# Patient Record
Sex: Male | Born: 2000 | Race: White | Hispanic: No | Marital: Single | Smoking: Never smoker
Health system: Southern US, Community
[De-identification: ages and names within clinical notes are randomized; demographics above are authoritative.]

## PROBLEM LIST (undated history)

## (undated) DIAGNOSIS — K311 Adult hypertrophic pyloric stenosis: Secondary | ICD-10-CM

## (undated) HISTORY — PX: ABDOMINAL SURGERY: SHX537

---
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
## 2018-02-08 ENCOUNTER — Other Ambulatory Visit: Payer: Self-pay

## 2018-02-08 ENCOUNTER — Emergency Department
Admission: EM | Admit: 2018-02-08 | Discharge: 2018-02-08 | Disposition: A | Discharge status: Home / Self Care | Payer: BC Managed Care – PPO | Attending: Emergency Medicine | Admitting: Emergency Medicine

## 2018-02-08 DIAGNOSIS — W450XXA Nail entering through skin, initial encounter: Secondary | ICD-10-CM | POA: Insufficient documentation

## 2018-02-08 DIAGNOSIS — Z23 Encounter for immunization: Secondary | ICD-10-CM | POA: Diagnosis not present

## 2018-02-08 DIAGNOSIS — Y999 Unspecified external cause status: Secondary | ICD-10-CM | POA: Diagnosis not present

## 2018-02-08 DIAGNOSIS — Y929 Unspecified place or not applicable: Secondary | ICD-10-CM | POA: Diagnosis not present

## 2018-02-08 DIAGNOSIS — S91311A Laceration without foreign body, right foot, initial encounter: Secondary | ICD-10-CM | POA: Diagnosis not present

## 2018-02-08 DIAGNOSIS — Y9301 Activity, walking, marching and hiking: Secondary | ICD-10-CM | POA: Diagnosis not present

## 2018-02-08 DIAGNOSIS — S99921A Unspecified injury of right foot, initial encounter: Secondary | ICD-10-CM | POA: Diagnosis present

## 2018-02-08 MED ORDER — TETANUS-DIPHTH-ACELL PERTUSSIS 5-2.5-18.5 LF-MCG/0.5 IM SUSP
0.5000 mL | Freq: Once | INTRAMUSCULAR | Status: AC
  Administered 2018-02-08: 0.5 mL via INTRAMUSCULAR
  Filled 2018-02-08: qty 0.5

## 2018-02-08 NOTE — ED Triage Notes (Signed)
Pt arrives to ED via POV from home with c/o abrasion to the lateral aspect of the pinkie toe on the RIGHT foot that happened 1hr PTA. No bleeding at this time; pt states he's unsure of last Tetanus booster and requesting an update "just to be safe". Pt is A&O, in NAD; RR even, regular, and unlabored.

## 2018-02-08 NOTE — ED Provider Notes (Signed)
[facility] Emergency Department Provider Note ____________________________________________  Time seen: 2040  I have reviewed the triage vital signs and the nursing notes.  HISTORY  Chief Complaint  Laceration  HPI Levi Carter is a 17 y.o. male presents to the ED accompanied by his family member for evaluation of a superficial laceration to the lateral right foot.  Patient describes about an hour prior to arrival, he accidentally cut his foot on exposed nail while wearing a sock.  He denies any other injury or puncture wound.  He presents now with a superficial lateral skin flap to the lateral aspect of the right foot at the pinky toe.  No other injuries reported. Patient is also also requesting a tetanus booster.  He is currently homeschooled and denies any recent vaccines.  History reviewed. No pertinent past medical history.  There are no active problems to display for this patient.  History reviewed. No pertinent surgical history.  Prior to Admission medications   Medication Sig Start Date End Date Taking? Authorizing Provider  cyclobenzaprine (FLEXERIL) 5 MG tablet Take 1 tablet (5 mg total) by mouth 3 (three) times daily as needed for muscle spasms. 10/03/16   Evon SlackGaines, Thomas C, PA-C  ibuprofen (ADVIL,MOTRIN) 600 MG tablet Take 1 tablet (600 mg total) by mouth every 6 (six) hours as needed for moderate pain. 10/03/16   Evon SlackGaines, Thomas C, PA-C    Allergies Patient has no known allergies.  No family history on file.  Social History Social History   Tobacco Use  . Smoking status: Never Smoker  . Smokeless tobacco: Never Used  Substance Use Topics  . Alcohol use: No  . Drug use: Not on file    Review of Systems  Constitutional: Negative for fever. Cardiovascular: Negative for chest pain. Respiratory: Negative for shortness of breath. Musculoskeletal: Negative for back pain. Skin: Negative for rash.  Right foot/toe laceration as  above. Neurological: Negative for headaches, focal weakness or numbness. ____________________________________________  PHYSICAL EXAM:  VITAL SIGNS: ED Triage Vitals [02/08/18 2033]  Enc Vitals Group     BP (!) 145/75     Pulse Rate (!) 109     Resp 18     Temp 97.9 F (36.6 C)     Temp Source Oral     SpO2 100 %     Weight 145 lb (65.8 kg)     Height 6\' 2"  (1.88 m)     Head Circumference      Peak Flow      Pain Score 0     Pain Loc      Pain Edu?      Excl. in GC?     Constitutional: Alert and oriented. Well appearing and in no distress. Head: Normocephalic and atraumatic. Cardiovascular: Normal rate, regular rhythm. Normal distal pulses. Normal cap refill.  Respiratory: Normal respiratory effort. No wheezes/rales/rhonchi. Gastrointestinal: Soft and nontender. No distention. Musculoskeletal: Nontender with normal range of motion in all extremities.  Neurologic:  Normal gait without ataxia. Normal speech and language. No gross focal neurologic deficits are appreciated. Skin:  Skin is warm, dry and intact. No rash noted.  Patient is noted to have a superficial lap laceration to the epidermal scan of the lateral right foot at the base of the pinky toe.  No active bleeding is appreciated.  Signs of infection are noted.  The wound is cleansed with soap and water and a single disposable bandages placed. ____________________________________________  PROCEDURES  Procedures Tdap 0.5 ml IM  ____________________________________________  INITIAL IMPRESSION / ASSESSMENT AND PLAN / ED COURSE  Patient with ED evaluation of a superficial laceration to the right foot and pinky.  Patient's wound is superficial and does not require any closure with either wound adhesive for sutures.  Wound is covered after local cleansing.  Patient is given wound care instructions.  He has a request for a Tdap booster as he is home schooled and has not had his most recent round of vaccines.  Patient will  follow up with his newly established PCP as scheduled. ____________________________________________  FINAL CLINICAL IMPRESSION(S) / ED DIAGNOSES  Final diagnoses:  Laceration of right foot, initial encounter      Lissa Hoard, PA-C 02/08/18 2104    Phineas Semen, MD 02/08/18 [phone]

## 2018-02-08 NOTE — Discharge Instructions (Signed)
Keep the wound clean, dry and covered. Wash only with soap and water. You have received a tetanus booster today. See your primary provider as scheduled.

## 2018-07-25 ENCOUNTER — Emergency Department
Admission: EM | Admit: 2018-07-25 | Discharge: 2018-07-26 | Disposition: A | Discharge status: Home / Self Care | Payer: BC Managed Care – PPO | Attending: Emergency Medicine | Admitting: Emergency Medicine

## 2018-07-25 ENCOUNTER — Emergency Department: Payer: BC Managed Care – PPO

## 2018-07-25 ENCOUNTER — Encounter: Payer: Self-pay | Admitting: Emergency Medicine

## 2018-07-25 DIAGNOSIS — X58XXXA Exposure to other specified factors, initial encounter: Secondary | ICD-10-CM | POA: Diagnosis not present

## 2018-07-25 DIAGNOSIS — K2210 Ulcer of esophagus without bleeding: Secondary | ICD-10-CM | POA: Diagnosis not present

## 2018-07-25 DIAGNOSIS — T18128A Food in esophagus causing other injury, initial encounter: Secondary | ICD-10-CM | POA: Diagnosis not present

## 2018-07-25 DIAGNOSIS — Y929 Unspecified place or not applicable: Secondary | ICD-10-CM | POA: Diagnosis not present

## 2018-07-25 DIAGNOSIS — T18108A Unspecified foreign body in esophagus causing other injury, initial encounter: Secondary | ICD-10-CM

## 2018-07-25 DIAGNOSIS — Z8719 Personal history of other diseases of the digestive system: Secondary | ICD-10-CM | POA: Diagnosis not present

## 2018-07-25 DIAGNOSIS — K3189 Other diseases of stomach and duodenum: Secondary | ICD-10-CM | POA: Diagnosis not present

## 2018-07-25 HISTORY — DX: Adult hypertrophic pyloric stenosis: K31.1

## 2018-07-25 MED ORDER — DEXAMETHASONE SODIUM PHOSPHATE 10 MG/ML IJ SOLN
INTRAMUSCULAR | Status: AC
  Filled 2018-07-25: qty 1

## 2018-07-25 MED ORDER — PROPOFOL 10 MG/ML IV BOLUS
INTRAVENOUS | Status: AC
  Filled 2018-07-25: qty 20

## 2018-07-25 MED ORDER — MIDAZOLAM HCL 2 MG/2ML IJ SOLN
INTRAMUSCULAR | Status: AC
  Filled 2018-07-25: qty 2

## 2018-07-25 MED ORDER — SUCCINYLCHOLINE CHLORIDE 20 MG/ML IJ SOLN
INTRAMUSCULAR | Status: AC
  Filled 2018-07-25: qty 1

## 2018-07-25 MED ORDER — GLUCAGON HCL (RDNA) 1 MG IJ SOLR
1.0000 mg | Freq: Once | INTRAMUSCULAR | Status: AC
  Administered 2018-07-25: 1 mg via INTRAMUSCULAR
  Filled 2018-07-25 (×2): qty 1

## 2018-07-25 MED ORDER — ONDANSETRON HCL 4 MG/2ML IJ SOLN
INTRAMUSCULAR | Status: AC
  Filled 2018-07-25: qty 2

## 2018-07-25 MED ORDER — ROCURONIUM BROMIDE 50 MG/5ML IV SOLN
INTRAVENOUS | Status: AC
  Filled 2018-07-25: qty 1

## 2018-07-25 MED ORDER — FENTANYL CITRATE (PF) 100 MCG/2ML IJ SOLN
INTRAMUSCULAR | Status: AC
Start: 2018-07-25 — End: ?
  Filled 2018-07-25: qty 2

## 2018-07-25 NOTE — Anesthesia Preprocedure Evaluation (Addendum)
Anesthesia Evaluation  Patient identified by MRN, date of birth, ID band Patient awake    Reviewed: Allergy & Precautions, H&P , NPO status , Patient's Chart, lab work & pertinent test results  Airway Mallampati: I  TM Distance: >3 FB Neck ROM: full    Dental no notable dental hx. 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  Past Surgical History: No date: ABDOMINAL SURGERY  BMI    Body Mass Index:  17.97 kg/m      Reproductive/Obstetrics negative OB ROS                            Anesthesia Physical Anesthesia Plan  ASA: I and emergent  Anesthesia Plan: General ETT   Post-op Pain Management:    Induction:   PONV Risk Score and Plan: Propofol infusion and TIVA  Airway Management Planned:   Additional Equipment:   Intra-op Plan:   Post-operative Plan:   Informed Consent: I have reviewed the patients History and Physical, chart, labs and discussed the procedure including the risks, benefits and alternatives for the proposed anesthesia with the patient or authorized representative who has indicated his/her understanding and acceptance.   Dental Advisory Given  Plan Discussed with: Anesthesiologist, CRNA and Surgeon  Anesthesia Plan Comments:        Anesthesia Quick Evaluation

## 2018-07-25 NOTE — ED Notes (Signed)
Pt states no resolve thus far

## 2018-07-25 NOTE — ED Provider Notes (Signed)
[facility] Emergency Department Provider Note  ____________________________________________  Time seen: Approximately 11:37 PM  I have reviewed the triage vital signs and the nursing notes.   HISTORY  Chief Complaint Foreign Body    HPI Levi Carter is a 17 y.o. 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  He does not think he swallowed any bones.  Had a sudden onset of feeling like his esophagus was obstructed and that he could not swallow anything.  His father tried doing a Heimlich maneuver on him which did not resolve his symptoms.  He is tried swallowing cola throughout the day without relief.  Difficulty swallowing his secretions 2.  No vomiting.  No fevers chills chest pain or shortness of breath.  No cough.  Symptoms are constant without aggravating or alleviating factors at this point.      Past Medical History:  Diagnosis Date  . Pyloric stenosis      There are no active problems to display for this patient.    Past Surgical History:  Procedure Laterality Date  . ABDOMINAL SURGERY       Prior to Admission medications   Not on File     Allergies Patient has no known allergies.   No family history on file.  Social History Social History   Tobacco Use  . Smoking status: Never Smoker  . Smokeless tobacco: Never Used  Substance Use Topics  . Alcohol use: No  . Drug use: Never    Review of Systems  Constitutional:   No fever or chills.  ENT:   No sore throat. No rhinorrhea.  Inability to swallow as above Cardiovascular:   No chest pain or syncope. Respiratory:   No dyspnea or cough. Gastrointestinal:   Negative for abdominal pain, vomiting and diarrhea.  Musculoskeletal:   Negative for focal pain or swelling All other systems reviewed and are negative except as documented above in ROS and  HPI.  ____________________________________________   PHYSICAL EXAM:  VITAL SIGNS: ED Triage Vitals  Enc Vitals Group     BP 07/25/18 2038 134/84     Pulse Rate 07/25/18 2038 (!) 103     Resp 07/25/18 2038 18     Temp 07/25/18 2038 98.2 F (36.8 C)     Temp Source 07/25/18 2038 Oral     SpO2 07/25/18 2038 100 %     Weight 07/25/18 2039 140 lb (63.5 kg)     Height 07/25/18 2039 6\' 2"  (1.88 m)     Head Circumference --      Peak Flow --      Pain Score 07/25/18 2039 1     Pain Loc --      Pain Edu? --      Excl. in GC? --     Vital signs reviewed, nursing assessments reviewed.   Constitutional:   Alert and oriented. Non-toxic appearance. Eyes:   Conjunctivae are normal. EOMI. PERRL. ENT      Head:   Normocephalic and atraumatic.      Nose:   No congestion/rhinnorhea.       Mouth/Throat:   MMM, no pharyngeal erythema. No peritonsillar mass.       Neck:   No meningismus. Full ROM.  Nontender no crepitus Hematological/Lymphatic/Immunilogical:   No cervical lymphadenopathy. Cardiovascular:   RRR. Symmetric bilateral radial and DP pulses.  No murmurs.  Cap refill less than 2 seconds. Respiratory:   Normal respiratory effort without tachypnea/retractions. Breath sounds are clear and equal bilaterally. No wheezes/rales/rhonchi. Gastrointestinal:   Soft and nontender. Non distended. There is no CVA tenderness.  No rebound, rigidity, or guarding. Musculoskeletal:   Normal range of motion in all extremities. No joint effusions.  No lower extremity tenderness.  No edema. Neurologic:   Normal speech and language.  Motor grossly intact. No acute focal neurologic deficits are appreciated.  Skin:    Skin is warm, dry and intact. No rash noted.  No petechiae, purpura, or bullae.  ____________________________________________    LABS (pertinent positives/negatives) (all labs ordered are listed, but only abnormal results are displayed) Labs Reviewed - No data to  display ____________________________________________   EKG    ____________________________________________    RADIOLOGY  Dg Neck Soft Tissue  Result Date: 07/25/2018 CLINICAL DATA:  Foreign body sensation in throat since 3 p.m. after eating chicken leg. EXAM: NECK SOFT TISSUES - 1+ VIEW COMPARISON:  None. FINDINGS: Subtle ossific density is seen projecting anterior to the C6 vertebral body measuring approximately 5 x 3 mm. This more likely reflects a slightly bulbous anterior tubercle of the transverse foramen of C6 a given its ossific appearance, the possibility of an ingested foreign body/chicken bone is not entirely excluded. CT may help for better assessment. No soft tissue emphysema to suggest perforation. No significant soft tissue thickening. No dilatation of the esophagus is identified to suggest impaction. IMPRESSION: Subtle ossific density anterior to the C6 vertebral body more likely reflects partial imaging of the anterior tubercle of one of the C6 transverse foramina. However the possibility of a ingested ossific foreign body is not entirely excluded. CT of the neck without IV contrast may help for further correlation. Electronically Signed   By: Tollie Eth M.D.   On: 07/25/2018 21:50   Ct Soft Tissue Neck Wo Contrast  Result Date: 07/25/2018 CLINICAL DATA:  Dysphagia after getting chicken stuck in throat this afternoon. Unable to swallow. EXAM: CT NECK WITHOUT CONTRAST TECHNIQUE: Multidetector CT imaging of the neck was performed following the standard protocol without intravenous contrast. COMPARISON:  Neck radiograph July 25, 2018 FINDINGS: PHARYNX AND LARYNX: Faint subcentimeter linear density LEFT superficial base of tongue in an area of motion artifact (sagittal image 37). Airway widely patent. Normal larynx. SALIVARY GLANDS: Normal. THYROID: Normal. LYMPH NODES: No lymphadenopathy by CT size criteria;, sensitivity decreased without contrast. VASCULAR: Normal. LIMITED  INTRACRANIAL: Normal. VISUALIZED ORBITS: Normal. MASTOIDS AND VISUALIZED PARANASAL SINUSES: Well-aerated. SKELETON: Nonacute. UPPER CHEST: Lung apices are clear. No superior mediastinal lymphadenopathy. OTHER: No abnormal calcification and prevertebral space to corroborate today's radiographic finding. IMPRESSION: 1. Subcentimeter radiopaque foreign body LEFT superficial base of tongue versus motion artifact. 2. Otherwise negative noncontrast CT neck. Electronically Signed   By: Awilda Metro M.D.   On: 07/25/2018 22:56    ____________________________________________   PROCEDURES Procedures  ____________________________________________  DIFFERENTIAL DIAGNOSIS   Esophageal foreign body, esophageal abrasion  CLINICAL IMPRESSION / ASSESSMENT AND PLAN / ED COURSE  Pertinent labs & imaging results that were available during my care of the patient were reviewed by me and considered in my medical decision making (see chart for details).    Patient presents with inability to swallow after feeling like chicken got stuck in his throat earlier today.  Glucagon, soft tissue neck x-ray given that he localizes the symptoms to the area of the thyroid cartilage.  May need GI consult  Clinical Course as of Jul 25 2336  Sun Jul 25, 2018  2313 X-ray showed possible radiopaque foreign body in the throat so CT was performed to further evaluate.  CT negative.  On reassessment patient is still unable to swallow at all.  No improvement in symptoms.  Discussed with Dr. Tobi Bastos of GI who will come do endoscopy.   [PS]    Clinical Course User Index [PS] Sharman Cheek, MD     ____________________________________________   FINAL CLINICAL IMPRESSION(S) / ED DIAGNOSES    Final diagnoses:  Esophageal foreign body, initial encounter     ED Discharge Orders    None      Portions of this note were generated with dragon dictation software. Dictation errors may occur despite best attempts at  proofreading.    Sharman Cheek, MD 07/25/18 [phone]

## 2018-07-25 NOTE — ED Notes (Signed)
Pt given cola prior to IM for assessment, states it feels like its just sitting in the bottom of his throat.

## 2018-07-25 NOTE — ED Triage Notes (Signed)
Patient states that he was eating chicken and that it got caught in his throat about 15:00. Patient states that since then he has not been able to swallow anything.

## 2018-07-26 ENCOUNTER — Encounter: Payer: Self-pay | Admitting: *Deleted

## 2018-07-26 ENCOUNTER — Encounter: Payer: Self-pay | Admitting: Emergency Medicine

## 2018-07-26 ENCOUNTER — Emergency Department: Payer: BC Managed Care – PPO | Admitting: Anesthesiology

## 2018-07-26 ENCOUNTER — Emergency Department
Admission: EM | Admit: 2018-07-26 | Discharge: 2018-07-26 | Disposition: A | Discharge status: Home / Self Care | Payer: BC Managed Care – PPO | Source: Home / Self Care

## 2018-07-26 ENCOUNTER — Encounter
Admission: EM | Disposition: A | Discharge status: Home / Self Care | Payer: Self-pay | Source: Home / Self Care | Attending: Emergency Medicine

## 2018-07-26 ENCOUNTER — Other Ambulatory Visit: Payer: Self-pay

## 2018-07-26 DIAGNOSIS — T18108A Unspecified foreign body in esophagus causing other injury, initial encounter: Secondary | ICD-10-CM | POA: Diagnosis not present

## 2018-07-26 DIAGNOSIS — K319 Disease of stomach and duodenum, unspecified: Secondary | ICD-10-CM

## 2018-07-26 DIAGNOSIS — Z043 Encounter for examination and observation following other accident: Secondary | ICD-10-CM | POA: Insufficient documentation

## 2018-07-26 DIAGNOSIS — Z0110 Encounter for examination of ears and hearing without abnormal findings: Secondary | ICD-10-CM

## 2018-07-26 HISTORY — PX: ESOPHAGOGASTRODUODENOSCOPY: SHX5428

## 2018-07-26 SURGERY — EGD (ESOPHAGOGASTRODUODENOSCOPY)
Anesthesia: General

## 2018-07-26 MED ORDER — OMEPRAZOLE 40 MG PO CPDR: 40.0000 mg | DELAYED_RELEASE_CAPSULE | Freq: Every day | ORAL | 1 refills | Status: DC

## 2018-07-26 MED ORDER — ROCURONIUM BROMIDE 100 MG/10ML IV SOLN
INTRAVENOUS | Status: DC | PRN
  Administered 2018-07-26: 5 mg via INTRAVENOUS

## 2018-07-26 MED ORDER — ACETAMINOPHEN 500 MG PO TABS: 1000.0000 mg | ORAL_TABLET | Freq: Once | ORAL | Status: DC | PRN

## 2018-07-26 MED ORDER — FENTANYL CITRATE (PF) 100 MCG/2ML IJ SOLN
INTRAMUSCULAR | Status: DC | PRN
  Administered 2018-07-26 (×2): 50 ug via INTRAVENOUS

## 2018-07-26 MED ORDER — SUCCINYLCHOLINE CHLORIDE 20 MG/ML IJ SOLN
INTRAMUSCULAR | Status: DC | PRN
  Administered 2018-07-26: 120 mg via INTRAVENOUS

## 2018-07-26 MED ORDER — PROPOFOL 10 MG/ML IV BOLUS
INTRAVENOUS | Status: DC | PRN
  Administered 2018-07-26: 50 mg via INTRAVENOUS
  Administered 2018-07-26: 150 mg via INTRAVENOUS

## 2018-07-26 MED ORDER — DEXAMETHASONE SODIUM PHOSPHATE 10 MG/ML IJ SOLN
INTRAMUSCULAR | Status: DC | PRN
  Administered 2018-07-26: 6 mg via INTRAVENOUS

## 2018-07-26 MED ORDER — SUGAMMADEX SODIUM 200 MG/2ML IV SOLN
INTRAVENOUS | Status: DC | PRN
  Administered 2018-07-26: 120 mg via INTRAVENOUS

## 2018-07-26 MED ORDER — DEXMEDETOMIDINE HCL IN NACL 200 MCG/50ML IV SOLN
INTRAVENOUS | Status: AC
  Filled 2018-07-26: qty 50

## 2018-07-26 MED ORDER — SODIUM CHLORIDE 0.9 % IV SOLN
INTRAVENOUS | Status: DC
  Administered 2018-07-26 (×2): via INTRAVENOUS

## 2018-07-26 MED ORDER — FENTANYL CITRATE (PF) 100 MCG/2ML IJ SOLN: 25.0000 ug | INTRAMUSCULAR | Status: DC | PRN

## 2018-07-26 MED ORDER — ONDANSETRON HCL 4 MG/2ML IJ SOLN
INTRAMUSCULAR | Status: DC | PRN
  Administered 2018-07-26: 4 mg via INTRAVENOUS

## 2018-07-26 MED ORDER — MIDAZOLAM HCL 5 MG/5ML IJ SOLN
INTRAMUSCULAR | Status: DC | PRN
  Administered 2018-07-26: 2 mg via INTRAVENOUS

## 2018-07-26 MED ORDER — DEXMEDETOMIDINE HCL 200 MCG/2ML IV SOLN
INTRAVENOUS | Status: DC | PRN
  Administered 2018-07-26: 8 ug via INTRAVENOUS

## 2018-07-26 NOTE — Transfer of Care (Signed)
Immediate Anesthesia Transfer of Care Note  Patient: Levi Carter  Procedure(s) Performed: ESOPHAGOGASTRODUODENOSCOPY (EGD) (N/A )  Patient Location: PACU  Anesthesia Type:General  Level of Consciousness: awake, alert  and oriented  Airway & Oxygen Therapy: Patient Spontanous Breathing and Patient connected to nasal cannula oxygen  Post-op Assessment: Report given to RN and Post -op Vital signs reviewed and stable  Post vital signs: Reviewed and stable  Last Vitals:  Vitals Value Taken Time  BP 129/56 07/26/2018  1:01 AM  Temp 36.8 C 07/26/2018  1:01 AM  Pulse 114 07/26/2018  1:04 AM  Resp 15 07/26/2018  1:04 AM  SpO2 100 % 07/26/2018  1:04 AM  Vitals shown include unvalidated device data.  Last Pain:  Vitals:   07/25/18 2039  TempSrc:   PainSc: 1          Complications: No apparent anesthesia complications

## 2018-07-26 NOTE — ED Triage Notes (Signed)
Patient ambulatory to triage with steady gait, without difficulty or distress noted; pt reports was walking in door when moth flew in left ear

## 2018-07-26 NOTE — Anesthesia Procedure Notes (Signed)
Procedure Name: Intubation Date/Time: 07/26/2018 12:40 AM Performed by: Dionne Bucy, CRNA Pre-anesthesia Checklist: Patient identified, Patient being monitored, Timeout performed, Emergency Drugs available and Suction available Patient Re-evaluated:Patient Re-evaluated prior to induction Oxygen Delivery Method: Circle system utilized Preoxygenation: Pre-oxygenation with 100% oxygen Induction Type: IV induction, Rapid sequence and Cricoid Pressure applied Laryngoscope Size: Mac and 3 Grade View: Grade I Tube type: Oral Tube size: 7.0 mm Number of attempts: 1 Airway Equipment and Method: Stylet Placement Confirmation: ETT inserted through vocal cords under direct vision,  positive ETCO2 and breath sounds checked- equal and bilateral Secured at: 22 cm Tube secured with: Tape Dental Injury: Teeth and Oropharynx as per pre-operative assessment

## 2018-07-26 NOTE — H&P (Signed)
     Levi Mood, MD [address], [place], [place], [address] [address], [place], [place], [address] Phone: [phone]  Fax: [phone]  Primary Care Physician:  Raynelle Bring   Pre-Procedure History & Physical: HPI:  Levi Carter is a 17 y.o. male is here for an endoscopy    Past Medical History:  Diagnosis Date  . Pyloric stenosis     Past Surgical History:  Procedure Laterality Date  . ABDOMINAL SURGERY      Prior to Admission medications   Not on File    Allergies as of 07/25/2018  . (No Known Allergies)    No family history on file.  Social History   Socioeconomic History  . Marital status: Single    Spouse name: Not on file  . Number of children: Not on file  . Years of education: Not on file  . Highest education level: Not on file  Occupational History  . Not on file  Social Needs  . Financial resource strain: Not on file  . Food insecurity:    Worry: Not on file    Inability: Not on file  . Transportation needs:    Medical: Not on file    Non-medical: Not on file  Tobacco Use  . Smoking status: Never Smoker  . Smokeless tobacco: Never Used  Substance and Sexual Activity  . Alcohol use: No  . Drug use: Never  . Sexual activity: Not on file  Lifestyle  . Physical activity:    Days per week: Not on file    Minutes per session: Not on file  . Stress: Not on file  Relationships  . Social connections:    Talks on phone: Not on file    Gets together: Not on file    Attends religious service: Not on file    Active member of club or organization: Not on file    Attends meetings of clubs or organizations: Not on file    Relationship status: Not on file  . Intimate partner violence:    Fear of current or ex partner: Not on file    Emotionally abused: Not on file    Physically abused: Not on file    Forced sexual activity: Not on file  Other Topics Concern  . Not on file  Social History Narrative  .  Not on file    Review of Systems: See HPI, otherwise negative ROS  Physical Exam: BP 134/84   Pulse (!) 103   Temp 98.2 F (36.8 C) (Oral)   Resp 18   Ht 6\' 2"  (1.88 m)   Wt 63.5 kg   SpO2 100%   BMI 17.97 kg/m  General:   Alert,  pleasant and cooperative in NAD Head:  Normocephalic and atraumatic. Neck:  Supple; no masses or thyromegaly. Lungs:  Clear throughout to auscultation, normal respiratory effort.    Heart:  +S1, +S2, Regular rate and rhythm, No edema. Abdomen:  Soft, nontender and nondistended. Normal bowel sounds, without guarding, and without rebound.   Neurologic:  Alert and  oriented x4;  grossly normal neurologically.  Impression/Plan: Levi Carter is here for an endoscopy  to be performed for  A food impaction    Risks, benefits, limitations, and alternatives regarding endoscopy have been reviewed with the patient.  Questions have been answered.  All parties agreeable.   Levi Mood, MD  07/26/2018, 12:33 AM

## 2018-07-26 NOTE — Anesthesia Post-op Follow-up Note (Signed)
Anesthesia QCDR form completed.        

## 2018-07-26 NOTE — ED Provider Notes (Signed)
[facility] Emergency Department Provider Note  ____________________________________________  Time seen: Approximately 10:35 PM  I have reviewed the triage vital signs and the nursing notes.   HISTORY  Chief Complaint Foreign Body in Ear    HPI Levi Carter is a 17 y.o. male presents emergency department for concerns that a moth flew into his right ear this evening.  He washed his ear out with hot water and no longer feels anything in his ear.  He came to the emergency department to confirm.  Past Medical History:  Diagnosis Date  . Pyloric stenosis     There are no active problems to display for this patient.   Past Surgical History:  Procedure Laterality Date  . ABDOMINAL SURGERY      Prior to Admission medications   Medication Sig Start Date End Date Taking? Authorizing Provider  omeprazole (PRILOSEC) 40 MG capsule Take 1 capsule (40 mg total) by mouth daily. 07/26/18 07/26/19  Wyline Mood, MD    Allergies Patient has no known allergies.  No family history on file.  Social History Social History   Tobacco Use  . Smoking status: Never Smoker  . Smokeless tobacco: Never Used  Substance Use Topics  . Alcohol use: No  . Drug use: Never     Review of Systems  Constitutional: No fever/chills Cardiovascular: No chest pain. Respiratory: No SOB. Gastrointestinal: No nausea, no vomiting.  Musculoskeletal: Negative for musculoskeletal pain. Skin: Negative for rash, abrasions, lacerations, ecchymosis.   ____________________________________________   PHYSICAL EXAM:  VITAL SIGNS: ED Triage Vitals [07/26/18 2212]  Enc Vitals Group     BP 125/72     Pulse Rate 89     Resp 18     Temp 97.9 F (36.6 C)     Temp Source Oral     SpO2 100 %     Weight 139 lb 15.9 oz (63.5 kg)     Height 6\' 2"  (1.88 m)     Head Circumference      Peak Flow      Pain Score 0     Pain Loc      Pain Edu?      Excl. in GC?      Constitutional:  Alert and oriented. Well appearing and in no acute distress. Eyes: Conjunctivae are normal. PERRL. EOMI. Head: Atraumatic. ENT:      Ears: Tympanic membranes are pearly.  No foreign body.      Nose: No congestion/rhinnorhea.      Mouth/Throat: Mucous membranes are moist.  Neck: No stridor.   Cardiovascular: Good peripheral circulation. Respiratory: Normal respiratory effort without tachypnea or retractions. Musculoskeletal: Full range of motion to all extremities. No gross deformities appreciated. Neurologic:  Normal speech and language. No gross focal neurologic deficits are appreciated.  Skin:  Skin is warm, dry and intact. No rash noted. Psychiatric: Mood and affect are normal. Speech and behavior are normal. Patient exhibits appropriate insight and judgement.   ____________________________________________   LABS (all labs ordered are listed, but only abnormal results are displayed)  Labs Reviewed - No data to display ____________________________________________  EKG   ____________________________________________  RADIOLOGY  ____________________________________________    PROCEDURES  Procedure(s) performed:    Procedures    Medications - No data to display   ____________________________________________   INITIAL IMPRESSION / ASSESSMENT AND PLAN / ED COURSE  Pertinent labs & imaging results that were available during my care of the patient were reviewed by me and considered in  my medical decision making (see chart for details).  Review of the [place] CSRS was performed in accordance of the [facility] prior to dispensing any controlled drugs.     Patient presented to emergency department to confirm no moth in his ear.  Vital signs and exam are reassuring.  No foreign body in ear canal.  Patient is to follow up with primary care as directed. Patient is given ED precautions to return to the ED for any worsening or new  symptoms.     ____________________________________________  FINAL CLINICAL IMPRESSION(S) / ED DIAGNOSES  Final diagnoses:  Normal ear exam      NEW MEDICATIONS STARTED DURING THIS VISIT:  ED Discharge Orders    None          This chart was dictated using voice recognition software/Dragon. Despite best efforts to proofread, errors can occur which can change the meaning. Any change was purely unintentional.    Enid Derry, PA-C 07/26/18 2310    Willy Eddy, MD 07/26/18 [phone]

## 2018-07-26 NOTE — Consult Note (Signed)
Wyline Mood , MD [address], [place], [place], [address] [address], [place], [place], [address] Phone: [phone]  Fax: [phone]  Consultation  Referring Provider:  ER  Primary Care Physician:  Raynelle Bring Primary Gastroenterologist:  None          Reason for Consultation:     Food impaction   Date of Admission:  07/25/2018 Date of Consultation:  07/26/2018         HPI:   Levi Carter is a 17 y.o. male presented to the ER a short while back with inability to swallow after having a chicken meal. Father performed Heimlich manoever which helped a bit but still could not tolerate his secretions  No similar episodes in the past . Denies any allergies or asthma  Past Medical History:  Diagnosis Date  . Pyloric stenosis     Past Surgical History:  Procedure Laterality Date  . ABDOMINAL SURGERY      Prior to Admission medications   Not on File    No family history on file.   Social History   Tobacco Use  . Smoking status: Never Smoker  . Smokeless tobacco: Never Used  Substance Use Topics  . Alcohol use: No  . Drug use: Never    Allergies as of 07/25/2018  . (No Known Allergies)    Review of Systems:    All systems reviewed and negative except where noted in HPI.   Physical Exam:  Vital signs in last 24 hours: Temp:  [98.2 F (36.8 C)] 98.2 F (36.8 C) (09/01 2038) Pulse Rate:  [103] 103 (09/01 2038) Resp:  [18] 18 (09/01 2038) BP: (134)/(84) 134/84 (09/01 2038) SpO2:  [100 %] 100 % (09/01 2038) Weight:  [63.5 kg] 63.5 kg (09/01 2039)   General:   Pleasant, cooperative in NAD Head:  Normocephalic and atraumatic. Eyes:   No icterus.   Conjunctiva pink. PERRLA. Ears:  Normal auditory acuity. Neck:  Supple; no masses or thyroidomegaly Lungs: Respirations even and unlabored. Lungs clear to auscultation bilaterally.   No wheezes, crackles, or rhonchi.  Heart:  Regular rate and rhythm;  Without murmur, clicks, rubs  or gallops Abdomen:  Soft, nondistended, nontender. Normal bowel sounds. No appreciable masses or hepatomegaly.  No rebound or guarding.  Neurologic:  Alert and oriented x3;  grossly normal neurologically. Skin:  Intact without significant lesions or rashes. Cervical Nodes:  No significant cervical adenopathy. Psych:  Alert and cooperative. Normal affect.  LAB RESULTS: No results for input(s): WBC, HGB, HCT, PLT in the last 72 hours. BMET No results for input(s): NA, K, CL, CO2, GLUCOSE, BUN, CREATININE, CALCIUM in the last 72 hours. LFT No results for input(s): PROT, ALBUMIN, AST, ALT, ALKPHOS, BILITOT, BILIDIR, IBILI in the last 72 hours. PT/INR No results for input(s): LABPROT, INR in the last 72 hours.  STUDIES: Dg Neck Soft Tissue  Result Date: 07/25/2018 CLINICAL DATA:  Foreign body sensation in throat since 3 p.m. after eating chicken leg. EXAM: NECK SOFT TISSUES - 1+ VIEW COMPARISON:  None. FINDINGS: Subtle ossific density is seen projecting anterior to the C6 vertebral body measuring approximately 5 x 3 mm. This more likely reflects a slightly bulbous anterior tubercle of the transverse foramen of C6 a given its ossific appearance, the possibility of an ingested foreign body/chicken bone is not entirely excluded. CT may help for better assessment. No soft tissue emphysema to suggest perforation. No significant soft tissue thickening. No dilatation of the  esophagus is identified to suggest impaction. IMPRESSION: Subtle ossific density anterior to the C6 vertebral body more likely reflects partial imaging of the anterior tubercle of one of the C6 transverse foramina. However the possibility of a ingested ossific foreign body is not entirely excluded. CT of the neck without IV contrast may help for further correlation. Electronically Signed   By: Tollie Eth M.D.   On: 07/25/2018 21:50   Ct Soft Tissue Neck Wo Contrast  Result Date: 07/25/2018 CLINICAL DATA:  Dysphagia after getting  chicken stuck in throat this afternoon. Unable to swallow. EXAM: CT NECK WITHOUT CONTRAST TECHNIQUE: Multidetector CT imaging of the neck was performed following the standard protocol without intravenous contrast. COMPARISON:  Neck radiograph July 25, 2018 FINDINGS: PHARYNX AND LARYNX: Faint subcentimeter linear density LEFT superficial base of tongue in an area of motion artifact (sagittal image 37). Airway widely patent. Normal larynx. SALIVARY GLANDS: Normal. THYROID: Normal. LYMPH NODES: No lymphadenopathy by CT size criteria;, sensitivity decreased without contrast. VASCULAR: Normal. LIMITED INTRACRANIAL: Normal. VISUALIZED ORBITS: Normal. MASTOIDS AND VISUALIZED PARANASAL SINUSES: Well-aerated. SKELETON: Nonacute. UPPER CHEST: Lung apices are clear. No superior mediastinal lymphadenopathy. OTHER: No abnormal calcification and prevertebral space to corroborate today's radiographic finding. IMPRESSION: 1. Subcentimeter radiopaque foreign body LEFT superficial base of tongue versus motion artifact. 2. Otherwise negative noncontrast CT neck. Electronically Signed   By: Awilda Metro M.D.   On: 07/25/2018 22:56      Impression / Plan:   Levi Carter is a 17 y.o. y/o male with a food impaction.   Plan  1. EGD  I have discussed alternative options, risks & benefits,  which include, but are not limited to, bleeding, infection, perforation,respiratory complication & drug reaction.  The patient agrees with this plan & written consent will be obtained.      Thank you for involving me in the care of this patient.      LOS: 0 days   Wyline Mood, MD  07/26/2018, 12:14 AM

## 2018-07-26 NOTE — Anesthesia Postprocedure Evaluation (Signed)
Anesthesia Post Note  Patient: Levi Carter  Procedure(s) Performed: ESOPHAGOGASTRODUODENOSCOPY (EGD) (N/A )  Patient location during evaluation: PACU Anesthesia Type: General Level of consciousness: awake and alert Pain management: pain level controlled Vital Signs Assessment: post-procedure vital signs reviewed and stable Respiratory status: spontaneous breathing, nonlabored ventilation, respiratory function stable and patient connected to nasal cannula oxygen Cardiovascular status: blood pressure returned to baseline and stable Postop Assessment: no apparent nausea or vomiting Anesthetic complications: no     Last Vitals:  Vitals:   07/25/18 2038 07/26/18 0101  BP: 134/84 (!) 129/56  Pulse: (!) 103 (!) 117  Resp: 18 11  Temp: 36.8 C 36.8 C  SpO2: 100% 100%    Last Pain:  Vitals:   07/25/18 2039  TempSrc:   PainSc: 1                  Jovita Gamma

## 2018-07-26 NOTE — Op Note (Signed)
[facility] Gastroenterology Patient Name: Levi Carter Procedure Date: 07/26/2018 12:08 AM MRN: [id number] Account #: [id number] Date of Birth: December 02, 2000 Admit Type: Emergency Department Age: 17 Room: [facility] ENDO ROOM 3 Gender: Male Note Status: Finalized Procedure:            Upper GI endoscopy Indications:          Dysphagia Providers:            Wyline Mood MD, MD Medicines:            General Anesthesia Complications:        No immediate complications. Procedure:            Pre-Anesthesia Assessment:                       - Prior to the procedure, a History and Physical was                        performed, and patient medications, allergies and                        sensitivities were reviewed. The patient's tolerance of                        previous anesthesia was reviewed.                       - The risks and benefits of the procedure and the                        sedation options and risks were discussed with the                        patient. All questions were answered and informed                        consent was obtained.                       - ASA Grade Assessment: II - A patient with mild                        systemic disease.                       After obtaining informed consent, the endoscope was                        passed under direct vision. Throughout the procedure,                        the patient's blood pressure, pulse, and oxygen                        saturations were monitored continuously. The Endoscope                        was introduced through the mouth, and advanced to the                        third part of duodenum. The upper GI endoscopy was  accomplished with ease. The patient tolerated the                        procedure well. Findings:      Diffuse mucosal flattening was found in the entire examined duodenum.       Biopsies for histology were taken with a cold forceps for evaluation of        celiac disease.      Food was found in the middle third of the esophagus. Removal was       accomplished with a Raptor grasping device. Biopsies were obtained from       the proximal and distal esophagus with cold forceps for histology of       suspected eosinophilic esophagitis.      The cardia and gastric fundus were normal on retroflexion. Impression:           - Flattened mucosa was found in the duodenum,                        suspicious for celiac disease. Biopsied.                       - Food was found in the esophagus. Removal was                        successful. Recommendation:       - Discharge patient to home (with escort).                       - Mechanical soft diet.                       - Use Prilosec (omeprazole) 40 mg PO daily for 6 weeks.                       - Await pathology results.                       - Return to my office in 2 weeks. Procedure Code(s):    --- Professional ---                       [phone], Esophagogastroduodenoscopy, flexible, transoral;                        with removal of foreign body(s)                       43239, Esophagogastroduodenoscopy, flexible, transoral;                        with biopsy, single or multiple Diagnosis Code(s):    --- Professional ---                       K31.89, Other diseases of stomach and duodenum                       T18.128A, Food in esophagus causing other injury,                        initial encounter  R13.10, Dysphagia, unspecified CPT copyright 2017 American Medical Association. All rights reserved. The codes documented in this report are preliminary and upon coder review may  be revised to meet current compliance requirements. Wyline Mood, MD Wyline Mood MD, MD 07/26/2018 12:50:48 AM This report has been signed electronically. Number of Addenda: 0 Note Initiated On: 07/26/2018 12:08 AM      [facility]

## 2018-07-26 NOTE — ED Notes (Signed)
Consent orders incomplete at time, will be completed by endo staff d/t Dr Darien Ramus requesting pt transport now

## 2018-07-27 ENCOUNTER — Encounter: Payer: Self-pay | Admitting: Gastroenterology

## 2018-07-29 ENCOUNTER — Telehealth: Payer: Self-pay | Admitting: Gastroenterology

## 2018-07-29 LAB — SURGICAL PATHOLOGY

## 2018-07-29 NOTE — Telephone Encounter (Signed)
Looked right now- still not resulted. Hoping may be ready by tomorrow.

## 2018-07-29 NOTE — Telephone Encounter (Signed)
Levi Carter pt mother left vm for Celiac disease results she states she will have pt fu with Dr. Lennie Muckle please call pt or mother for results

## 2018-08-02 ENCOUNTER — Telehealth: Payer: Self-pay

## 2018-08-02 NOTE — Telephone Encounter (Signed)
LVM for "Harun" to call office (results).  Was unsure if this was the correct number because voice mail said "Raynelle Fanning".  Thanks [employer]

## 2018-08-03 ENCOUNTER — Ambulatory Visit: Payer: BC Managed Care – PPO | Admitting: Gastroenterology

## 2018-08-03 ENCOUNTER — Telehealth: Payer: Self-pay | Admitting: Gastroenterology

## 2018-08-03 ENCOUNTER — Telehealth: Payer: Self-pay

## 2018-08-03 NOTE — Telephone Encounter (Signed)
Discussed results with patient.  He said he will call his mother to let her know what the results were and call office back tomorrow to schedule office visit with Dr. Tobi Bastos to discuss results.  Thanks [employer]

## 2018-08-03 NOTE — Telephone Encounter (Signed)
Pt mom is calling   for results

## 2018-08-03 NOTE — Telephone Encounter (Signed)
LVM for Kaori to call back regarding results.  Thanks [employer]

## 2018-08-03 NOTE — Telephone Encounter (Signed)
LVM with mother to let her know that I can not disclose her son's lab results to her because she is not on his HIPPA form and to please have her son to call our office.  Thanks [employer]

## 2018-08-12 ENCOUNTER — Encounter: Payer: Self-pay | Admitting: Gastroenterology

## 2018-08-16 ENCOUNTER — Ambulatory Visit: Payer: BC Managed Care – PPO | Admitting: Gastroenterology

## 2018-09-21 ENCOUNTER — Ambulatory Visit (INDEPENDENT_AMBULATORY_CARE_PROVIDER_SITE_OTHER): Payer: BC Managed Care – PPO | Admitting: Gastroenterology

## 2018-09-21 ENCOUNTER — Encounter: Payer: Self-pay | Admitting: Gastroenterology

## 2018-09-21 VITALS — BP 111/72 | HR 83 | Ht 74.0 in | Wt 135.4 lb

## 2018-09-21 DIAGNOSIS — K200 Eosinophilic esophagitis: Secondary | ICD-10-CM

## 2018-09-21 MED ORDER — OMEPRAZOLE 40 MG PO CPDR: 40.0000 mg | DELAYED_RELEASE_CAPSULE | Freq: Every day | ORAL | 3 refills | Status: AC

## 2018-09-21 MED ORDER — FLUTICASONE PROPIONATE HFA 220 MCG/ACT IN AERO: 1.0000 | INHALATION_SPRAY | Freq: Four times a day (QID) | RESPIRATORY_TRACT | 1 refills | Status: AC

## 2018-09-21 NOTE — Addendum Note (Signed)
Addended by: Daisy Blossom on: 09/21/2018 03:03 PM   Modules accepted: Orders

## 2018-09-21 NOTE — Progress Notes (Signed)
Wyline Mood MD, MRCP(U.K) [address]  [address]  [place], [place] [address]  Main: [phone]  Fax: [phone]   Primary Care Physician: Raynelle Bring  Primary Gastroenterologist:  Dr. Wyline Mood   No chief complaint on file.   HPI: Levi Carter is a 17 y.o. male    He is here today to see me to establish care.  Was consulted to see him when he presented with a food impaction on 07/25/2018.  Father performed Heimlich maneuver which helped a bit but he could still not tolerate his secretions.  EGD was performed on 07/26/2018 and a large piece of chicken was taken out from the esophagus and the underlying mucosa appeared extremely erythematous.  These were taken from the esophagus which demonstrated up to 50 eosinophils per high-power field.  Acute erosive esophagitis with neutrophilic infiltrate and exudate.  I also took biopsies from the duodenal mucosa as it appeared a bit atrophic but the pathology report shows the villi are intact.  He says he has had on and off issues with swallowing upto 3 times years.   Never really took his Prilosec.   No current facility-administered medications for this visit.    Current Outpatient Medications  Medication Sig Dispense Refill  . omeprazole (PRILOSEC) 40 MG capsule Take 1 capsule (40 mg total) by mouth daily. 90 capsule 3   Facility-Administered Medications Ordered in Other Visits  Medication Dose Route Frequency Provider Last Rate Last Dose  . 0.9 %  sodium chloride infusion   Intravenous Continuous Wyline Mood, MD 20 mL/hr at 07/26/18 0013    . acetaminophen (TYLENOL) tablet 1,000 mg  1,000 mg Oral Once PRN Jovita Gamma, MD      . fentaNYL (SUBLIMAZE) injection 25 mcg  25 mcg Intravenous Q5 min PRN Jovita Gamma, MD        Allergies as of 09/21/2018  . (No Known Allergies)    ROS:  General: Negative for anorexia, weight loss, fever, chills, fatigue, weakness. ENT: Negative for hoarseness,  difficulty swallowing , nasal congestion. CV: Negative for chest pain, angina, palpitations, dyspnea on exertion, peripheral edema.  Respiratory: Negative for dyspnea at rest, dyspnea on exertion, cough, sputum, wheezing.  GI: See history of present illness. GU:  Negative for dysuria, hematuria, urinary incontinence, urinary frequency, nocturnal urination.  Endo: Negative for unusual weight change.    Physical Examination:   BP 111/72   Pulse 83   Ht 6\' 2"  (1.88 m)   Wt 135 lb 6.4 oz (61.4 kg)   BMI 17.38 kg/m   General: Well-nourished, well-developed in no acute distress.  Eyes: No icterus. Conjunctivae pink. Mouth: Oropharyngeal mucosa moist and pink , no lesions erythema or exudate. Lungs: Clear to auscultation bilaterally. Non-labored. Heart: Regular rate and rhythm, no murmurs rubs or gallops.  Abdomen: Bowel sounds are normal, nontender, nondistended, no hepatosplenomegaly or masses, no abdominal bruits or hernia , no rebound or guarding.   Extremities: No lower extremity edema. No clubbing or deformities. Neuro: Alert and oriented x 3.  Grossly intact. Skin: Warm and dry, no jaundice.   Psych: Alert and cooperative, normal mood and affect.   Imaging Studies: No results found.  Assessment and Plan:   Levi Carter is a 17 y.o. y/o male here to follow-up to see me after he was discharged from the hospital in 08/13/2018 when he presented with the first episode of food impaction.  The biopsies confirm that he has eosinophilic esophagitis.  Plan 1.  Prilosec 40 mg once a day 2.  Provided information on eosinophilic esophagitis 3.  Fluticasone inhaler for 6 weeks. 4.  Blood testing to determine if she is allergic to any foods 5.  Celiac serology  Dr Wyline Mood  MD,MRCP [facility]) Follow up in 8 weeks

## 2018-09-22 ENCOUNTER — Telehealth: Payer: Self-pay | Admitting: Gastroenterology

## 2018-09-22 NOTE — Telephone Encounter (Signed)
Returned call to [facility] regarding directions for 220 mcg Flovent Inhaler.  Directions provided to Debbie, pharmacist at [facility], as follows: 2 sprays and swallow two times a day for 6 weeks.  Do not eat or drink 30 mins afterwards.  Mouth should be rinsed with water 30 minutes after medication.  Thank you,  Marcelino Duster CMA

## 2018-09-22 NOTE — Telephone Encounter (Signed)
Judeth Cornfield from [facility] has a question about directions on rx Flovent inhaler 220 microgram cb [phone]  ref# [id number]

## 2018-09-24 LAB — FOOD ALLERGY PROFILE
Clam IgE: <0.1 kU/L
Egg White IgE: <0.1 kU/L
Milk IgE: <0.1 kU/L
Peanut IgE: <0.1 kU/L
Scallop IgE: <0.1 kU/L
Sesame Seed IgE: <0.1 kU/L
Shrimp IgE: <0.1 kU/L
Soybean IgE: <0.1 kU/L

## 2018-09-24 LAB — CELIAC DISEASE PANEL
ENDOMYSIAL IGA: NEGATIVE
IGA/IMMUNOGLOBULIN A, SERUM: 131 mg/dL (ref 90–386)
Transglutaminase IgA: <2 U/mL (ref 0–3)

## 2018-11-15 ENCOUNTER — Encounter: Payer: Self-pay | Admitting: Gastroenterology

## 2018-11-15 ENCOUNTER — Ambulatory Visit: Payer: BC Managed Care – PPO | Admitting: Gastroenterology

## 2019-08-12 ENCOUNTER — Encounter: Payer: Self-pay | Admitting: Emergency Medicine

## 2019-08-12 ENCOUNTER — Other Ambulatory Visit: Payer: Self-pay

## 2019-08-12 ENCOUNTER — Emergency Department
Admission: EM | Admit: 2019-08-12 | Discharge: 2019-08-13 | Disposition: A | Discharge status: Home / Self Care | Payer: BC Managed Care – PPO | Attending: Emergency Medicine | Admitting: Emergency Medicine

## 2019-08-12 DIAGNOSIS — Z20828 Contact with and (suspected) exposure to other viral communicable diseases: Secondary | ICD-10-CM | POA: Diagnosis not present

## 2019-08-12 DIAGNOSIS — Y9389 Activity, other specified: Secondary | ICD-10-CM | POA: Diagnosis not present

## 2019-08-12 DIAGNOSIS — X58XXXA Exposure to other specified factors, initial encounter: Secondary | ICD-10-CM | POA: Insufficient documentation

## 2019-08-12 DIAGNOSIS — Y999 Unspecified external cause status: Secondary | ICD-10-CM | POA: Insufficient documentation

## 2019-08-12 DIAGNOSIS — Y929 Unspecified place or not applicable: Secondary | ICD-10-CM | POA: Diagnosis not present

## 2019-08-12 DIAGNOSIS — T189XXA Foreign body of alimentary tract, part unspecified, initial encounter: Secondary | ICD-10-CM | POA: Diagnosis present

## 2019-08-12 DIAGNOSIS — T18128A Food in esophagus causing other injury, initial encounter: Secondary | ICD-10-CM | POA: Diagnosis not present

## 2019-08-12 DIAGNOSIS — Z79899 Other long term (current) drug therapy: Secondary | ICD-10-CM | POA: Insufficient documentation

## 2019-08-12 LAB — CBC
HCT: 43.6 % (ref 39.0–52.0)
Hemoglobin: 14.7 g/dL (ref 13.0–17.0)
MCH: 29.8 pg (ref 26.0–34.0)
MCHC: 33.7 g/dL (ref 30.0–36.0)
MCV: 88.3 fL (ref 80.0–100.0)
Platelets: 267 10*3/uL (ref 150–400)
RBC: 4.94 MIL/uL (ref 4.22–5.81)
RDW: 12 % (ref 11.5–15.5)
WBC: 9.3 10*3/uL (ref 4.0–10.5)
nRBC: 0 % (ref 0.0–0.2)

## 2019-08-12 LAB — COMPREHENSIVE METABOLIC PANEL
ALT: 19 U/L (ref 0–44)
AST: 22 U/L (ref 15–41)
Albumin: 4.7 g/dL (ref 3.5–5.0)
Alkaline Phosphatase: 76 U/L (ref 38–126)
Anion gap: 8 (ref 5–15)
BUN: 11 mg/dL (ref 6–20)
CO2: 24 mmol/L (ref 22–32)
Calcium: 9.7 mg/dL (ref 8.9–10.3)
Chloride: 107 mmol/L (ref 98–111)
Creatinine, Ser: 0.88 mg/dL (ref 0.61–1.24)
GFR calc Af Amer: >60 mL/min (ref >60)
GFR calc non Af Amer: >60 mL/min (ref >60)
Glucose, Bld: 121 mg/dL — ABNORMAL HIGH (ref 70–99)
Potassium: 4.3 mmol/L (ref 3.5–5.1)
Sodium: 139 mmol/L (ref 135–145)
Total Bilirubin: 0.6 mg/dL (ref 0.3–1.2)
Total Protein: 7.2 g/dL (ref 6.5–8.1)

## 2019-08-12 MED ORDER — GLUCAGON HCL RDNA (DIAGNOSTIC) 1 MG IJ SOLR
1.0000 mg | Freq: Once | INTRAMUSCULAR | Status: AC
  Administered 2019-08-12: 1 mg via INTRAVENOUS
  Filled 2019-08-12: qty 1

## 2019-08-12 NOTE — ED Notes (Signed)
Pt is able to swallow saliva but states he continues to feel like something "is stuck in my throat". No resp distress noted.

## 2019-08-12 NOTE — ED Triage Notes (Signed)
Pt arrives POV to triage with c/o steak in throat. Pt reports that this occurred around 2130 and that this is the second time that this has happened to him. Pt is able to talk in complete sentences at this time and is maintaining secretions.

## 2019-08-13 LAB — SARS CORONAVIRUS 2 BY RT PCR (HOSPITAL ORDER, PERFORMED IN ~~LOC~~ HOSPITAL LAB): SARS Coronavirus 2: NEGATIVE

## 2019-08-13 SURGERY — EGD (ESOPHAGOGASTRODUODENOSCOPY)
Anesthesia: General

## 2019-08-13 NOTE — ED Provider Notes (Signed)
[facility] Emergency Department Provider Note   ____________________________________________    I have reviewed the triage vital signs and the nursing notes.   HISTORY  Chief Complaint Swallowed Foreign Body     HPI Levi Carter is a 18 y.o. male who reports that approximately 9:30 PM he was eating steak and felt a piece lodged in his lower esophagus.  He reports this is happened before and he required endoscopy.  Review of medical records demonstrates endoscopy performed by Dr. Vicente Males, there was some concern of eosinophilic esophagitis however in follow-up no laboratory abnormalities discovered.  Patient is tolerating saliva and some liquids.  No difficulty breathing  Past Medical History:  Diagnosis Date  . Pyloric stenosis     There are no active problems to display for this patient.   Past Surgical History:  Procedure Laterality Date  . ABDOMINAL SURGERY    . ESOPHAGOGASTRODUODENOSCOPY N/A 07/26/2018   Procedure: ESOPHAGOGASTRODUODENOSCOPY (EGD);  Surgeon: Jonathon Bellows, MD;  Location: [facility] ENDOSCOPY;  Service: Gastroenterology;  Laterality: N/A;    Prior to Admission medications   Medication Sig Start Date End Date Taking? Authorizing Provider  fluticasone (FLOVENT HFA) 220 MCG/ACT inhaler Inhale 1 puff into the lungs 4 (four) times daily. 09/21/18 11/02/18  Jonathon Bellows, MD  omeprazole (PRILOSEC) 40 MG capsule Take 1 capsule (40 mg total) by mouth daily. 09/21/18 09/21/19  Jonathon Bellows, MD     Allergies Patient has no known allergies.  No family history on file.  Social History Social History   Tobacco Use  . Smoking status: Never Smoker  . Smokeless tobacco: Never Used  Substance Use Topics  . Alcohol use: No  . Drug use: Never    Review of Systems  Constitutional: No fever/chills Eyes: No visual changes.  ENT: As above Cardiovascular: Denies chest pain. Respiratory: Denies shortness of breath. Gastrointestinal: No  abdominal pain, as above Genitourinary: Negative for dysuria. Musculoskeletal: Negative for back pain. Skin: Negative for rash. Neurological: Negative for headaches   ____________________________________________   PHYSICAL EXAM:  VITAL SIGNS: ED Triage Vitals  Enc Vitals Group     BP 08/12/19 2208 (!) 159/66     Pulse Rate 08/12/19 2208 (!) 108     Resp 08/12/19 2208 18     Temp 08/12/19 2208 97.8 F (36.6 C)     Temp Source 08/12/19 2208 Oral     SpO2 08/12/19 2208 100 %     Weight 08/12/19 2209 63.5 kg (140 lb)     Height 08/12/19 2209 1.778 m (5\' 10" )     Head Circumference --      Peak Flow --      Pain Score 08/12/19 2209 2     Pain Loc --      Pain Edu? --      Excl. in [place]? --     Constitutional: Alert and oriented. No acute distress. Pleasant and interactive  Nose: No congestion/rhinnorhea. Mouth/Throat: Mucous membranes are moist.   Neck:  Painless ROM, no stridor Cardiovascular: Normal rate, regular rhythm.  Good peripheral circulation. Respiratory: Normal respiratory effort.  No retractions. Lungs CTAB. Gastrointestinal: Soft and nontender. No distention.   Musculoskeletal: Warm and well perfused Neurologic:  Normal speech and language. No gross focal neurologic deficits are appreciated.  Skin:  Skin is warm, dry and intact. No rash noted. Psychiatric: Mood and affect are normal. Speech and behavior are normal.  ____________________________________________   LABS (all labs ordered are listed, but only abnormal  results are displayed)  Labs Reviewed  COMPREHENSIVE METABOLIC PANEL - Abnormal; Notable for the following components:      Result Value   Glucose, Bld 121 (*)    All other components within normal limits  SARS CORONAVIRUS 2 (HOSPITAL ORDER, PERFORMED IN [facility])  CBC   ____________________________________________  EKG None ____________________________________________  RADIOLOGY  None  ____________________________________________   PROCEDURES  Procedure(s) performed: No  Procedures   Critical Care performed: No ____________________________________________   INITIAL IMPRESSION / ASSESSMENT AND PLAN / ED COURSE  Pertinent labs & imaging results that were available during my care of the patient were reviewed by me and considered in my medical decision making (see chart for details).  Patient with likely esophageal food impaction, tolerating saliva, tolerating some liquids.  Will trial glucagon  No significant provement with glucagon, have discussed with Dr. Allegra LaiVanga of GI, she will do endoscopy later this morning  ----------------------------------------- 1:25 AM on 08/13/2019 -----------------------------------------  Patient reports he feels as if food impaction may have passed, he is able to drink water now with no discomfort.  He would like to go home, he notes that he will return if any change in his condition.    ____________________________________________   FINAL CLINICAL IMPRESSION(S) / ED DIAGNOSES  Final diagnoses:  Food impaction of esophagus, initial encounter        Note:  This document was prepared using Dragon voice recognition software and may include unintentional dictation errors.   Jene EveryKinner, [place], MD 08/13/19 [phone]

## 2019-08-13 NOTE — ED Notes (Signed)
Pt now cannot swallow saliva and is spitting into a bag. Airway patent.

## 2020-01-12 IMAGING — CT CT NECK W/O CM
3 of 5 series · 9 of 33 positions shown, 10 images · non-contrast
Comparison: Neck radiograph July 25, 2018

CLINICAL DATA: Dysphagia after getting chicken stuck in throat this
afternoon. Unable to swallow.

EXAM:
CT NECK WITHOUT CONTRAST
TECHNIQUE: Multidetector CT imaging of the neck was performed following the
standard protocol without intravenous contrast.

[Series 7: sag neck · sagittal · 0.43mm/px · 5 of 64 slices shown]
[im 22/64  bone]
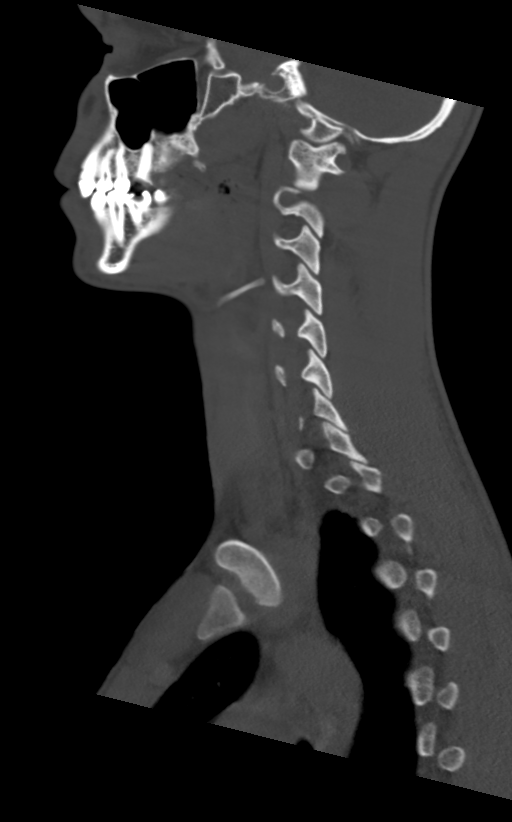
[im 27/64  bone]
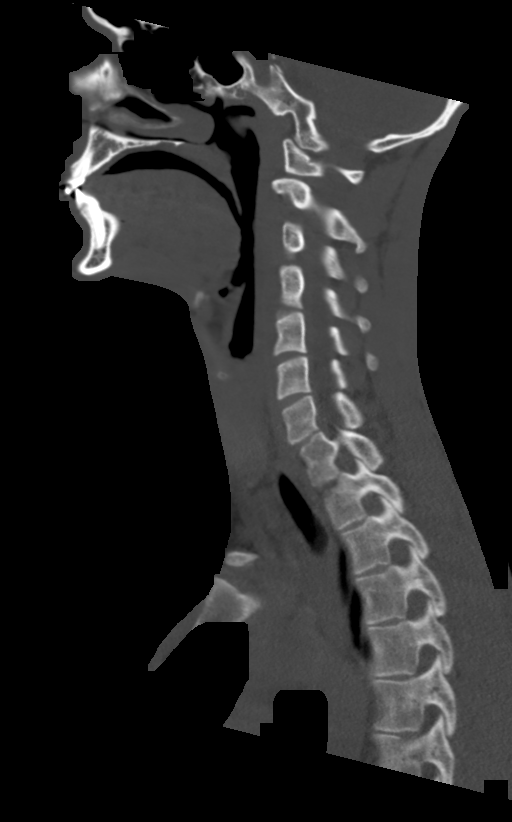
[im 32/64  bone]
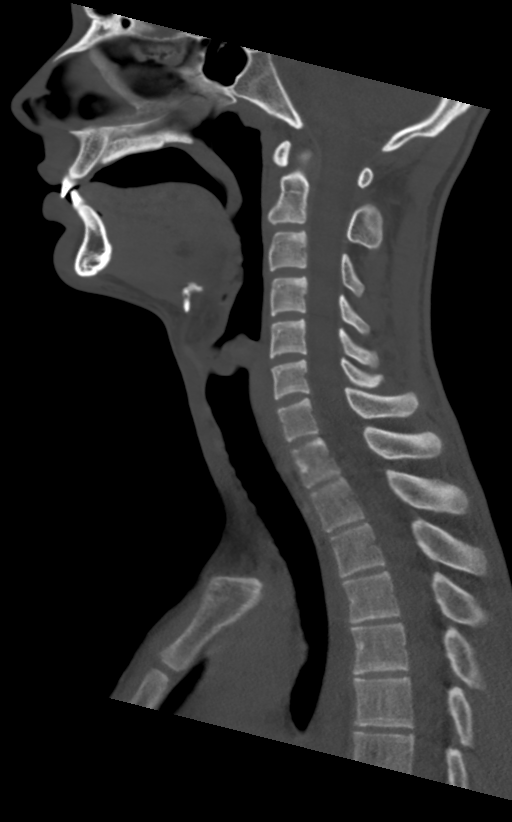
[im 37/64  bone]
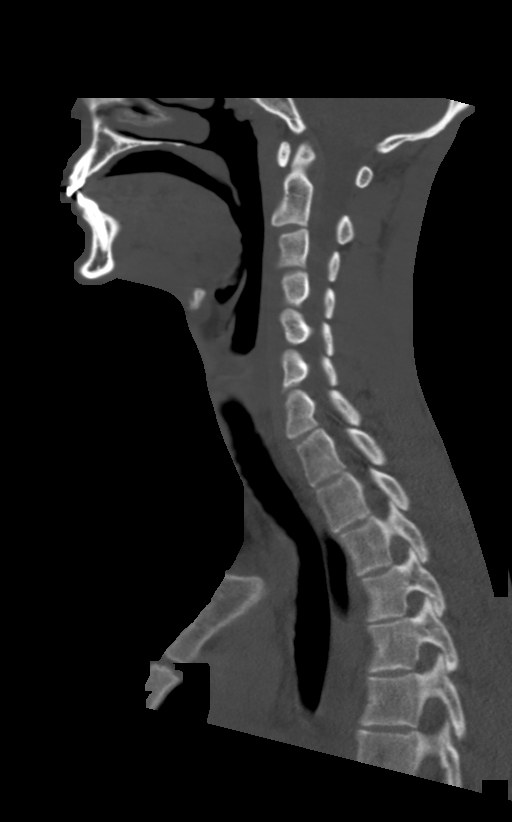
[im 43/64  bone]
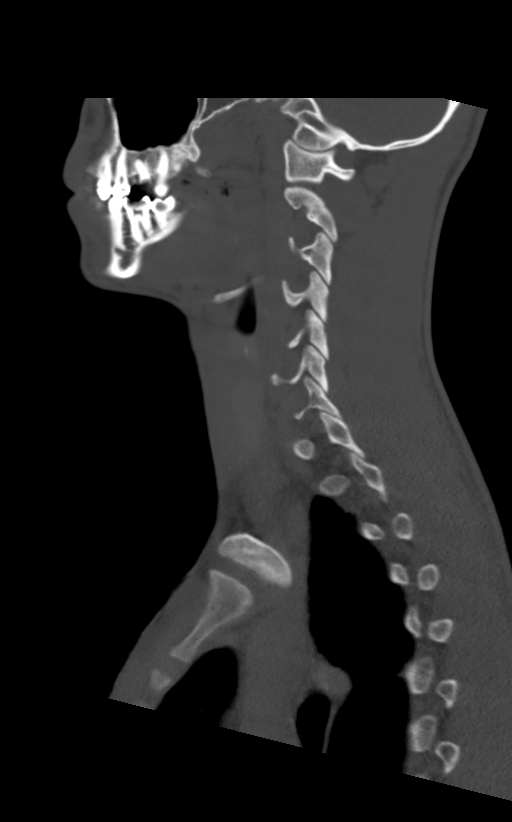

[Series 8: cor neck · coronal · 0.25mm/px · 3 of 111 slices shown]
[im 26/111  bone]
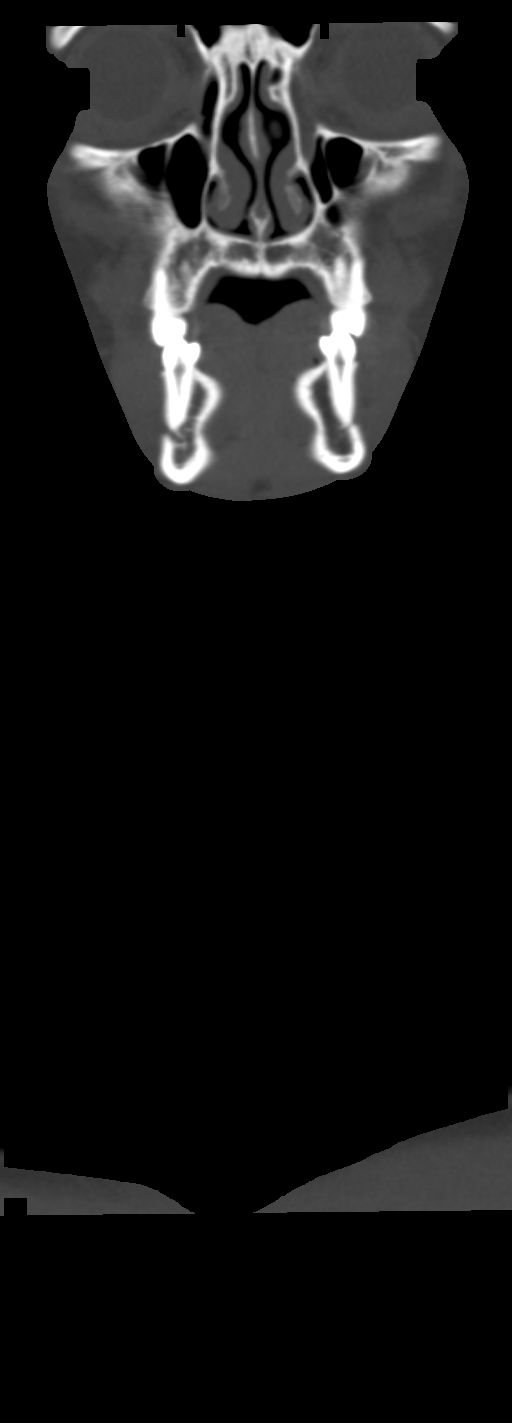
[im 46/111  bone]
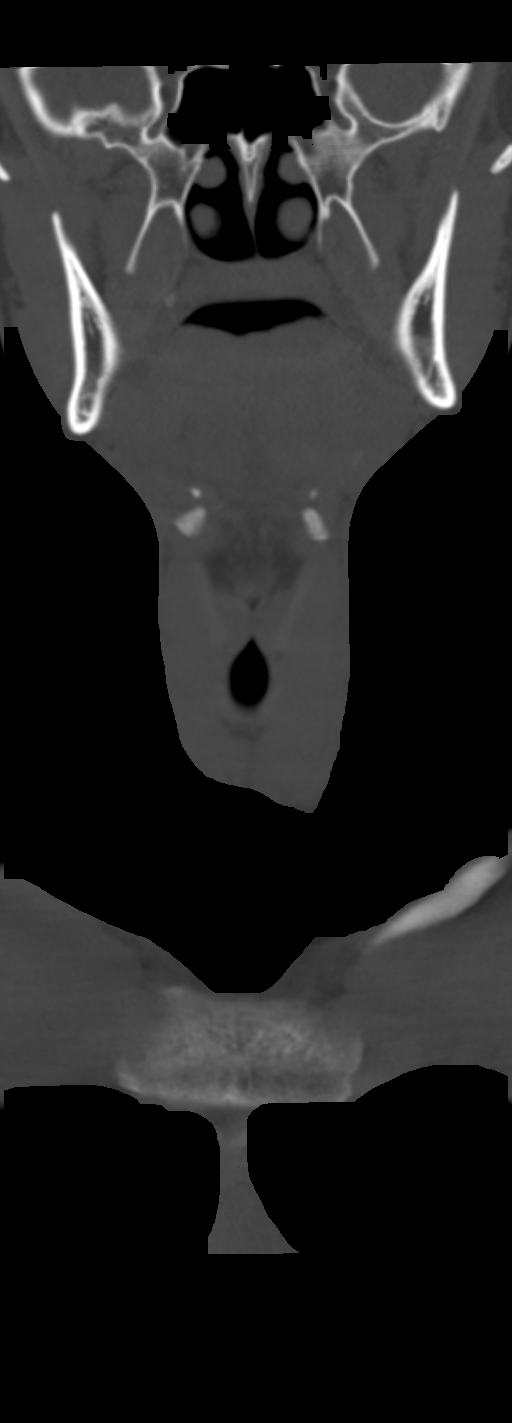
[im 66/111  bone]
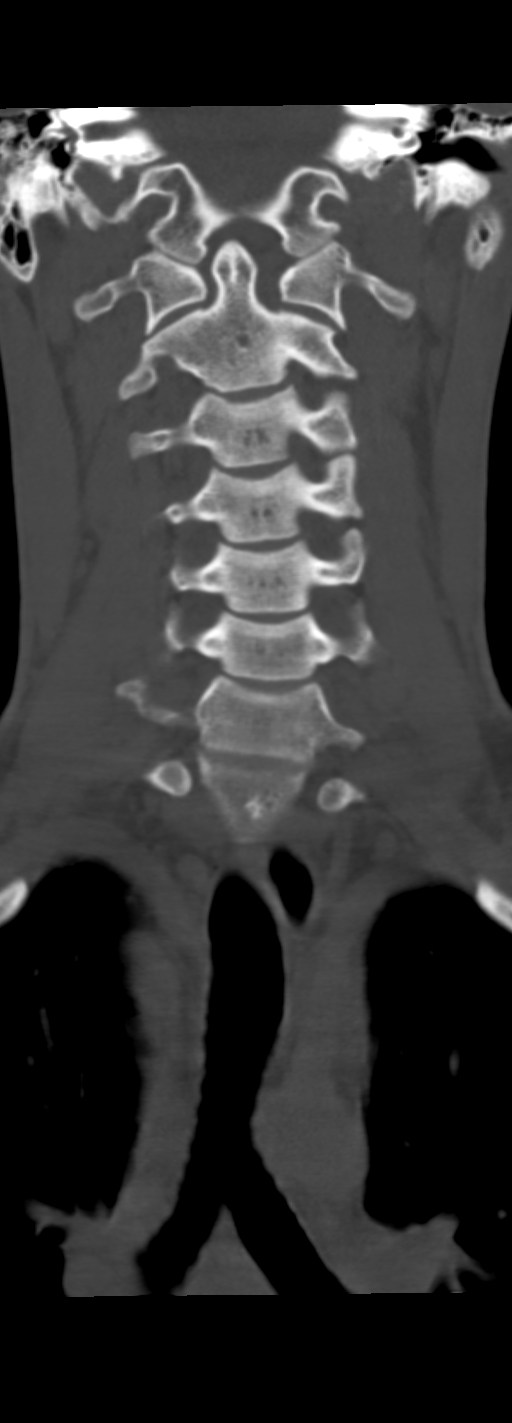

[Series 9: orthogonal ax · axial · 0.25mm/px · z∈[-241,-241]mm · 1 of 173 slices shown, 2 images]
[im 104/173  soft-tissue]
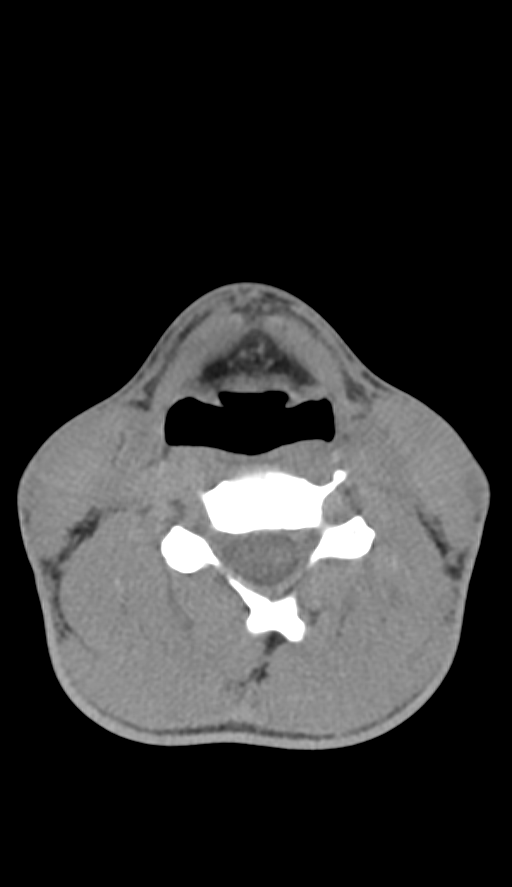
[im 104/173  bone]
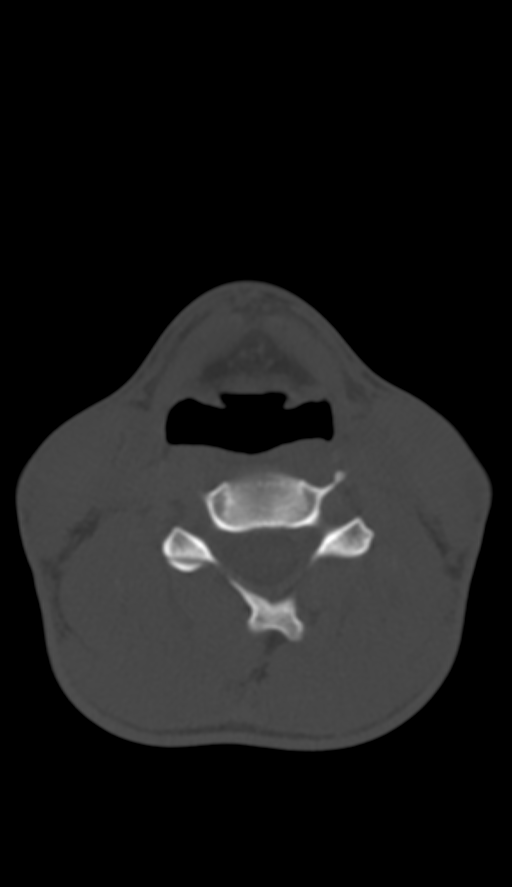

[9 of 33 positions shown; findings below may reference images not displayed]

FINDINGS: PHARYNX AND LARYNX: Faint subcentimeter linear density LEFT
superficial base of tongue in an area of motion artifact (sagittal
image 37). Airway widely patent. Normal larynx.

SALIVARY GLANDS: Normal.

THYROID: Normal.

LYMPH NODES: No lymphadenopathy by CT size criteria;, sensitivity
decreased without contrast.

VASCULAR: Normal.

LIMITED INTRACRANIAL: Normal.

VISUALIZED ORBITS: Normal.

MASTOIDS AND VISUALIZED PARANASAL SINUSES: Well-aerated.

SKELETON: Nonacute.

UPPER CHEST: Lung apices are clear. No superior mediastinal
lymphadenopathy.

OTHER: No abnormal calcification and prevertebral space to
corroborate today's radiographic finding.
IMPRESSION: 1. Subcentimeter radiopaque foreign body LEFT superficial base of
tongue versus motion artifact.
2. Otherwise negative noncontrast CT neck.

## 2020-01-12 IMAGING — CR DG NECK SOFT TISSUE
2 series · 2 of 2 positions shown · non-contrast
Comparison: None.

CLINICAL DATA: Foreign body sensation in throat since 3 p.m. after
eating chicken leg.

EXAM:
NECK SOFT TISSUES - 1+ VIEW

[neck lat]
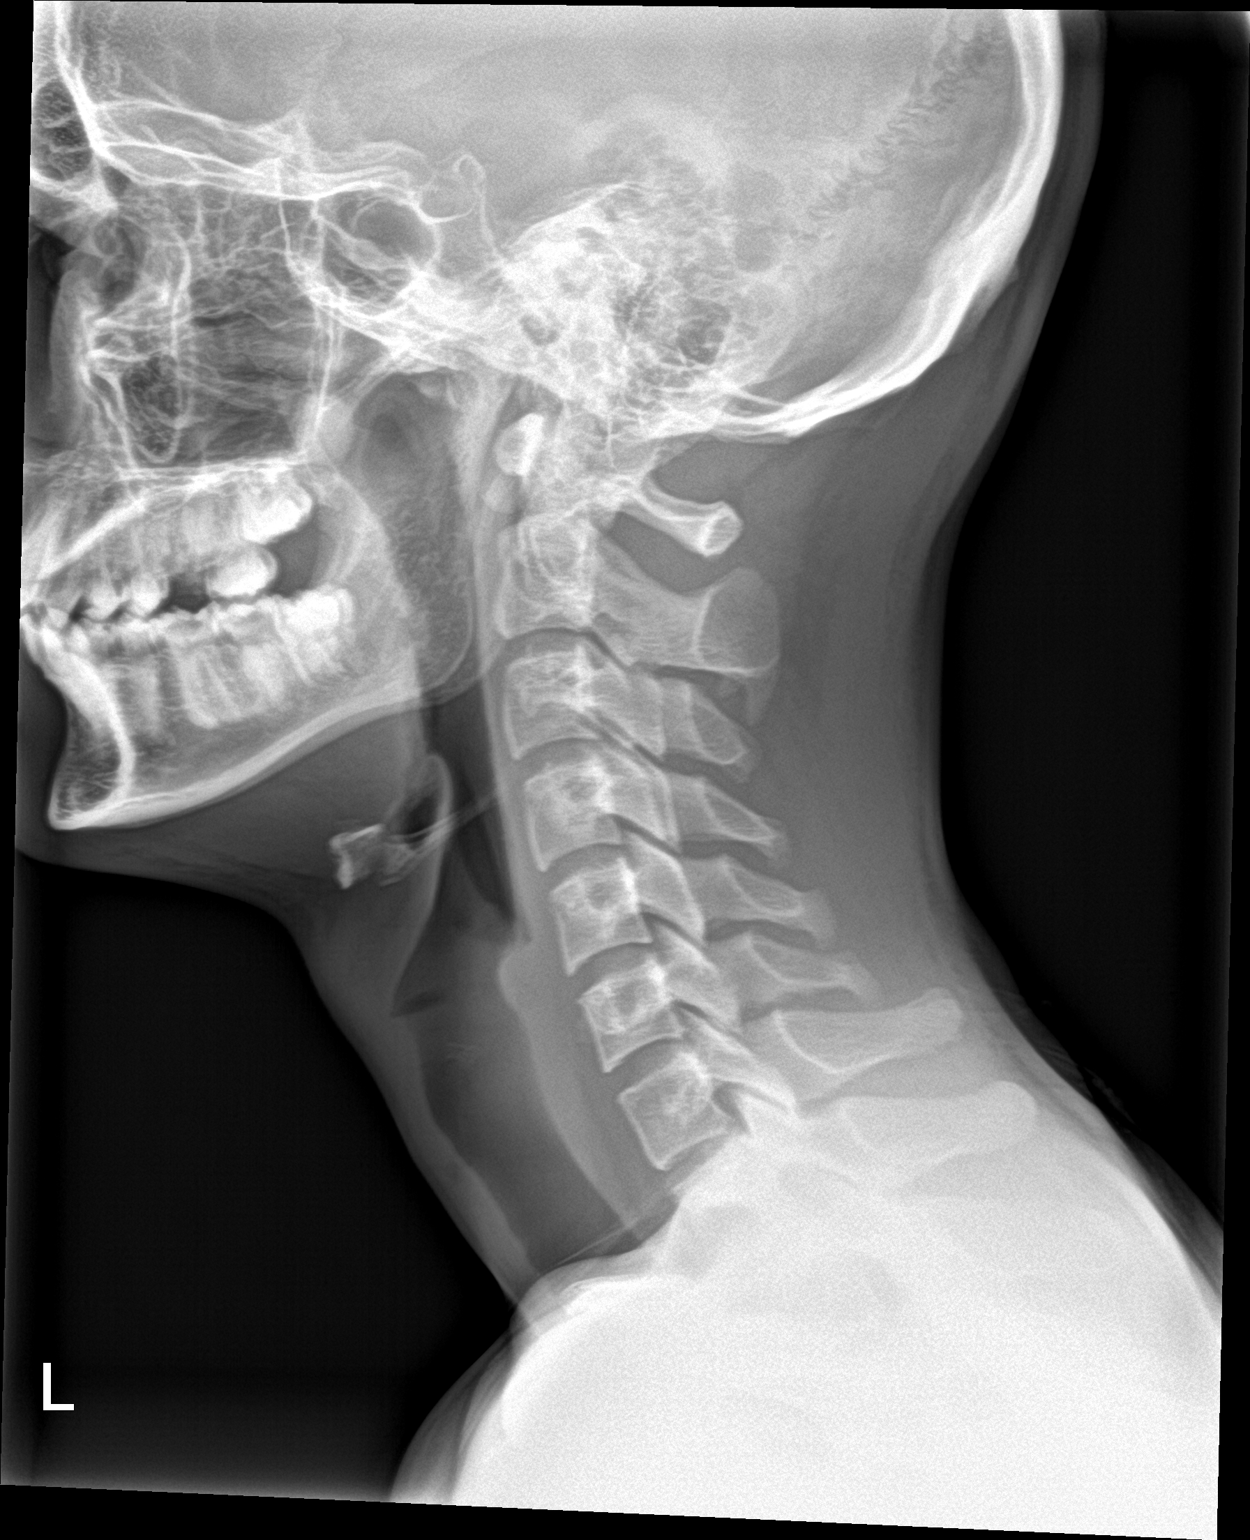

[neck ap]
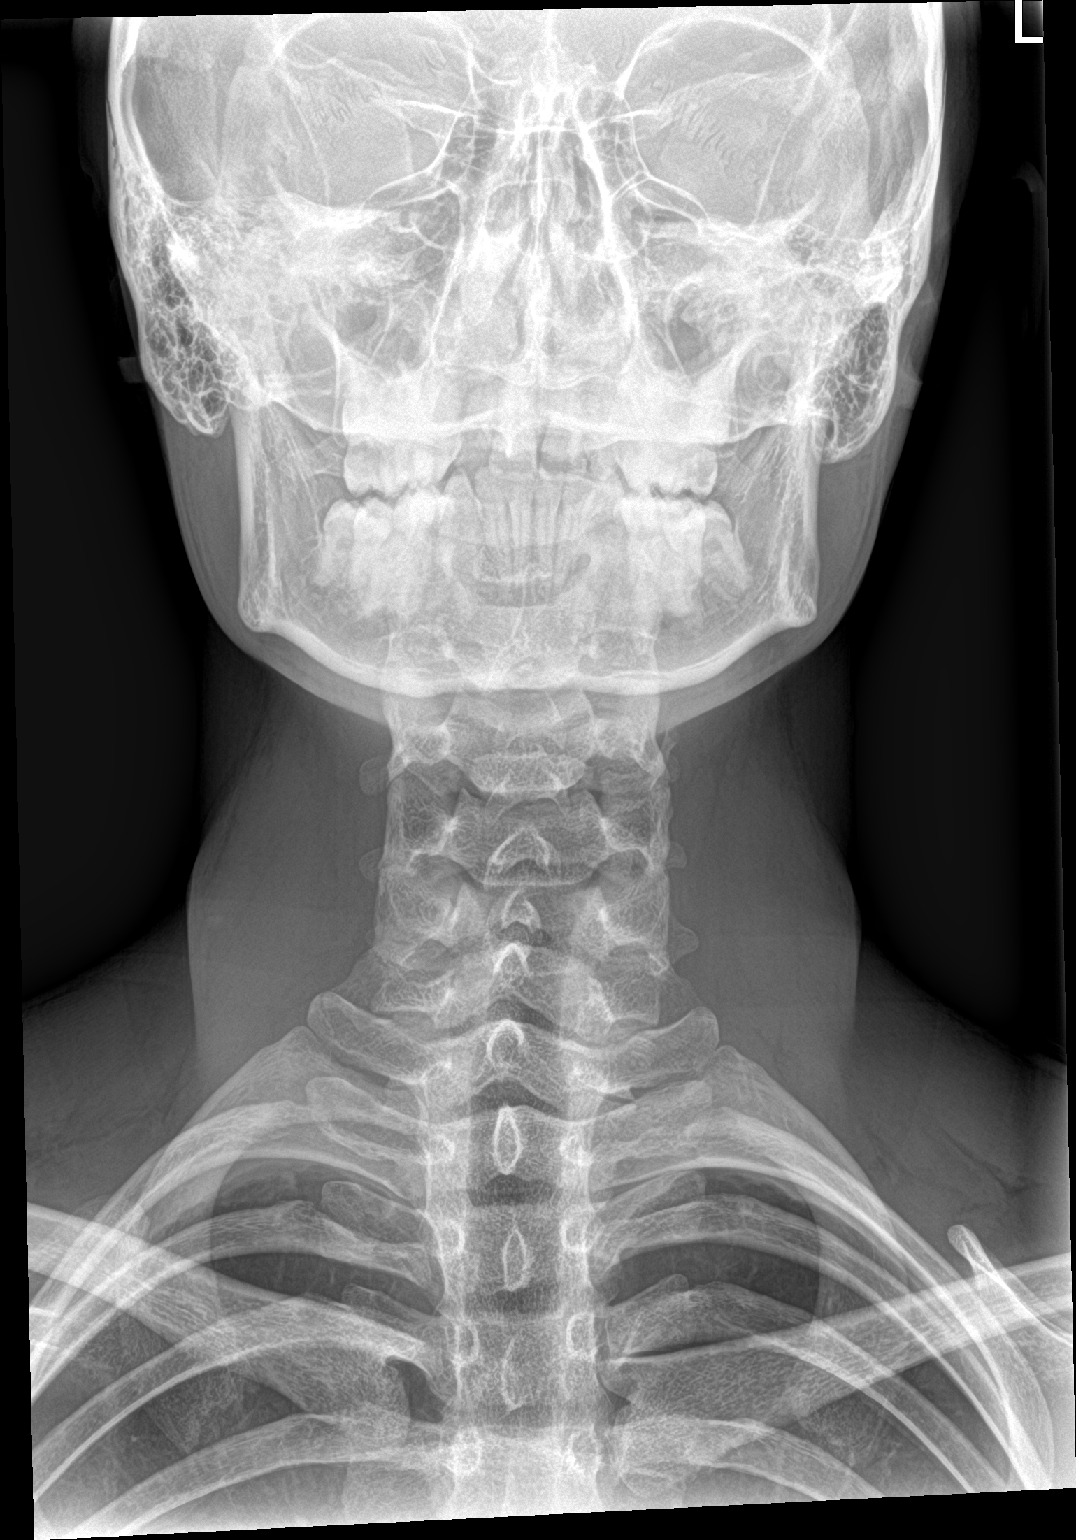

[2 of 2 positions shown; findings below may reference images not displayed]

FINDINGS: Subtle ossific density is seen projecting anterior to the C6
vertebral body measuring approximately 5 x 3 mm. This more likely
reflects a slightly bulbous anterior tubercle of the transverse
foramen of C6 a given its ossific appearance, the possibility of an
ingested foreign body/chicken bone is not entirely excluded. CT may
help for better assessment. No soft tissue emphysema to suggest
perforation. No significant soft tissue thickening. No dilatation of
the esophagus is identified to suggest impaction.
IMPRESSION: Subtle ossific density anterior to the C6 vertebral body more likely
reflects partial imaging of the anterior tubercle of one of the C6
transverse foramina. However the possibility of a ingested ossific
foreign body is not entirely excluded. CT of the neck without IV
contrast may help for further correlation.
